# Patient Record
Sex: Male | Born: 1970
Health system: Southern US, Community
[De-identification: ages and names within clinical notes are randomized; demographics above are authoritative.]

## PROBLEM LIST (undated history)

## (undated) DIAGNOSIS — E785 Hyperlipidemia, unspecified: Secondary | ICD-10-CM

## (undated) HISTORY — DX: Hyperlipidemia, unspecified: E78.5

## (undated) HISTORY — PX: KNEE ARTHROSCOPY: SUR90

---

## 2015-06-09 DIAGNOSIS — Z131 Encounter for screening for diabetes mellitus: Secondary | ICD-10-CM | POA: Diagnosis not present

## 2015-06-09 DIAGNOSIS — Z13220 Encounter for screening for lipoid disorders: Secondary | ICD-10-CM | POA: Diagnosis not present

## 2015-06-09 DIAGNOSIS — Z0000 Encounter for general adult medical examination without abnormal findings: Secondary | ICD-10-CM | POA: Diagnosis not present

## 2016-09-13 DIAGNOSIS — Z131 Encounter for screening for diabetes mellitus: Secondary | ICD-10-CM | POA: Diagnosis not present

## 2016-09-13 DIAGNOSIS — G4700 Insomnia, unspecified: Secondary | ICD-10-CM | POA: Diagnosis not present

## 2016-09-13 DIAGNOSIS — Z0000 Encounter for general adult medical examination without abnormal findings: Secondary | ICD-10-CM | POA: Diagnosis not present

## 2016-09-13 DIAGNOSIS — Z13220 Encounter for screening for lipoid disorders: Secondary | ICD-10-CM | POA: Diagnosis not present

## 2016-12-07 DIAGNOSIS — Z23 Encounter for immunization: Secondary | ICD-10-CM | POA: Diagnosis not present

## 2017-07-10 ENCOUNTER — Emergency Department (HOSPITAL_COMMUNITY)
Admission: EM | Admit: 2017-07-10 | Discharge: 2017-07-10 | Disposition: A | Discharge status: Home / Self Care | Payer: BLUE CROSS/BLUE SHIELD | Attending: Physician Assistant | Admitting: Physician Assistant

## 2017-07-10 ENCOUNTER — Encounter (HOSPITAL_COMMUNITY): Payer: Self-pay | Admitting: Emergency Medicine

## 2017-07-10 ENCOUNTER — Emergency Department (HOSPITAL_COMMUNITY): Payer: BLUE CROSS/BLUE SHIELD

## 2017-07-10 ENCOUNTER — Other Ambulatory Visit: Payer: Self-pay

## 2017-07-10 DIAGNOSIS — R0789 Other chest pain: Secondary | ICD-10-CM | POA: Insufficient documentation

## 2017-07-10 DIAGNOSIS — R079 Chest pain, unspecified: Secondary | ICD-10-CM | POA: Diagnosis not present

## 2017-07-10 LAB — CBC
HCT: 42.5 % (ref 39.0–52.0)
HEMOGLOBIN: 14.9 g/dL (ref 13.0–17.0)
MCH: 32.2 pg (ref 26.0–34.0)
MCHC: 35.1 g/dL (ref 30.0–36.0)
MCV: 91.8 fL (ref 78.0–100.0)
Platelets: 304 10*3/uL (ref 150–400)
RBC: 4.63 MIL/uL (ref 4.22–5.81)
RDW: 12.1 % (ref 11.5–15.5)
WBC: 6.5 10*3/uL (ref 4.0–10.5)

## 2017-07-10 LAB — I-STAT TROPONIN, ED
Troponin i, poc: 0 ng/mL (ref 0.00–0.08)
Troponin i, poc: 0 ng/mL (ref 0.00–0.08)

## 2017-07-10 LAB — BASIC METABOLIC PANEL
ANION GAP: 9 (ref 5–15)
BUN: 11 mg/dL (ref 6–20)
CHLORIDE: 105 mmol/L (ref 101–111)
CO2: 25 mmol/L (ref 22–32)
CREATININE: 0.88 mg/dL (ref 0.61–1.24)
Calcium: 9.4 mg/dL (ref 8.9–10.3)
GFR calc Af Amer: >60 mL/min (ref >60)
GFR calc non Af Amer: >60 mL/min (ref >60)
Glucose, Bld: 108 mg/dL — ABNORMAL HIGH (ref 65–99)
Potassium: 3.7 mmol/L (ref 3.5–5.1)
Sodium: 139 mmol/L (ref 135–145)

## 2017-07-10 LAB — D-DIMER, QUANTITATIVE: D-Dimer, Quant: <0.27 ug/mL-FEU (ref 0.00–0.50)

## 2017-07-10 NOTE — ED Provider Notes (Signed)
[facility] EMERGENCY DEPARTMENT Provider Note   CSN: [id number] Arrival date & time: 07/10/17  1246     History   Chief Complaint Chief Complaint  Patient presents with  . Chest Pain    HPI Khiree Bukhari is a 47 y.o. male.  HPI   Patient is a 47 year old male with chest pain.  Patient has no past medical history of hypertension hyperlipidemia or diabetes.  Patient did have a father who had a heart attack at the age of 79.  Patient otherwise is a non-smoker.  She awoke at 2 AM this morning with feeling of indigestion as well as chest pressure.  Patient has been feeling a little bit lightheaded since.  No radiation of chest pain to either arms or neck.  Patient has no shortness of breath or diaphoresis.  Patient does travel long distances for work including multiple plane flights this week over 2 hours long.  Of note patient is also been working in the yard, using a pressure washer.  He reports the he has chest pain that feels mildly worse with movement, not with exertion.  History reviewed. No pertinent past medical history.  There are no active problems to display for this patient.   History reviewed. No pertinent surgical history.      Home Medications    Prior to Admission medications   Not on File    Family History No family history on file.  Social History Social History   Tobacco Use  . Smoking status: Never Smoker  . Smokeless tobacco: Never Used  Substance Use Topics  . Alcohol use: Yes  . Drug use: Not Currently     Allergies   Patient has no known allergies.   Review of Systems Review of Systems  Constitutional: Negative for fatigue and fever.  Respiratory: Positive for chest tightness.   Cardiovascular: Positive for chest pain. Negative for palpitations and leg swelling.  Gastrointestinal: Negative for nausea.  All other systems reviewed and are negative.    Physical Exam Updated Vital Signs BP 130/88   Pulse 78    Temp 98.1 F (36.7 C) (Oral)   Resp (!) 28   Ht  (1.676 m)   Wt 90.7 kg (200 lb)   SpO2 98%   BMI 32.28 kg/m   Physical Exam  Constitutional: He is oriented to person, place, and time. He appears well-nourished.  HENT:  Head: Normocephalic and atraumatic.  Eyes: Pupils are equal, round, and reactive to light. Conjunctivae and EOM are normal.  Cardiovascular: Normal rate, regular rhythm, intact distal pulses and normal pulses.  Pulmonary/Chest: Effort normal and breath sounds normal. No accessory muscle usage. No respiratory distress.  Neurological: He is oriented to person, place, and time.  Skin: Skin is warm and dry. He is not diaphoretic.  Psychiatric: He has a normal mood and affect. His behavior is normal.  Nursing note and vitals reviewed.    ED Treatments / Results  Labs (all labs ordered are listed, but only abnormal results are displayed) Labs Reviewed  BASIC METABOLIC PANEL - Abnormal; Notable for the following components:      Result Value   Glucose, Bld 108 (*)    All other components within normal limits  CBC  D-DIMER, QUANTITATIVE (NOT AT [facility])  I-STAT TROPONIN, ED  I-STAT TROPONIN, ED    EKG EKG Interpretation  Date/Time:  Sunday Jul 10 2017 13:13:24 EDT Ventricular Rate:  70 PR Interval:  184 QRS Duration: 84 QT Interval:  392 QTC Calculation: 423 R Axis:   -9 Text Interpretation:  Normal sinus rhythm ST & T wave abnormality, consider inferior ischemia Abnormal ECG T wave inversion 3 ST depression in V4-V6 Confirmed by [place], Reesa Gotschall ([id number]) on 07/10/2017 3:40:48 PM   Radiology Dg Chest 2 View  Result Date: 07/10/2017 CLINICAL DATA:  Chest pain and tightness. Dizziness and lightheadedness. EXAM: CHEST - 2 VIEW COMPARISON:  None. FINDINGS: Normal sized heart. Clear lungs. Mild diffuse peribronchial thickening. Mild thoracic spine degenerative changes. IMPRESSION: Mild bronchitic changes. Electronically Signed   By: Beckie Salts M.D.   On:  07/10/2017 13:25    Procedures Procedures (including critical care time)  Medications Ordered in ED Medications - No data to display   Initial Impression / Assessment and Plan / ED Course  I have reviewed the triage vital signs and the nursing notes.  Pertinent labs & imaging results that were available during my care of the patient were reviewed by me and considered in my medical decision making (see chart for details).     Patient is a 47 year old male with chest pain.  Patient has no past medical history of hypertension hyperlipidemia or diabetes.  Patient did have a father who had a heart attack at the age of 11.  Patient otherwise is a non-smoker.  She awoke at 2 AM this morning with feeling of indigestion as well as chest pressure.  Patient has been feeling a little bit lightheaded since.  No radiation of chest pain to either arms or neck.  Patient has no shortness of breath or diaphoresis.  Patient does travel long distances for work including multiple plane flights this week over 2 hours long.  Of note patient is also been working in the yard, using a pressure washer.  He reports the he has chest pain that feels mildly worse with movement, not with exertion. Does not radiate to back.  5:52 PM Delta troponin negative.  However patient has abnormal EKG with mild depressions in lateral leads and T wave inversion in lead III.  Slight depressions, posterior EKG ordered.  Reassuring.  6:47 PM Discussed with cardiology.  They reviewed the EKG and feel that it is safe to go home.  Follow-up with PCP.  Discussed diagnostic uncertainty and return with chest pain.  Final Clinical Impressions(s) / ED Diagnoses   Final diagnoses:  Atypical chest pain    ED Discharge Orders    None       Abelino Derrick, MD 07/10/17 [phone]

## 2017-07-10 NOTE — Discharge Instructions (Addendum)
We are unsure what caused your chest pain today.  We are reassured by troponins that were negative as well as your EKG.  We had cardiology review your EKG as well.  We want you to follow-up with them as an outpatient.  The phone number was provided.  If you have any increase in chest pain, especially associated with shortness of breath, sweating or radiation down either of your arms please return immediately to the emergency department.

## 2017-07-10 NOTE — ED Triage Notes (Signed)
Pt. Stated, I was woken up by indigestion and took a Zantac, feeling dizziness and lightheadedness.

## 2017-07-10 NOTE — ED Notes (Signed)
Pt departed in NAD, refused use of wheelchair.  

## 2017-09-29 DIAGNOSIS — Z0289 Encounter for other administrative examinations: Secondary | ICD-10-CM | POA: Diagnosis not present

## 2017-09-29 DIAGNOSIS — Z130 Encounter for screening for diseases of the blood and blood-forming organs and certain disorders involving the immune mechanism: Secondary | ICD-10-CM | POA: Diagnosis not present

## 2017-09-29 DIAGNOSIS — Z13220 Encounter for screening for lipoid disorders: Secondary | ICD-10-CM | POA: Diagnosis not present

## 2017-09-29 DIAGNOSIS — Z136 Encounter for screening for cardiovascular disorders: Secondary | ICD-10-CM | POA: Diagnosis not present

## 2017-10-03 DIAGNOSIS — M79671 Pain in right foot: Secondary | ICD-10-CM | POA: Diagnosis not present

## 2017-12-05 DIAGNOSIS — Z23 Encounter for immunization: Secondary | ICD-10-CM | POA: Diagnosis not present

## 2019-02-12 NOTE — Progress Notes (Addendum)
Patient referred by Orpah Melter, MD for palpitations  Subjective:   Michael Potter, male    DOB: 1970/11/02, 48 y.o.   MRN: [id number]   Chief Complaint  Patient presents with  . Palpitations     HPI  48 y.o. Caucasian male male with strong family history of early coronary artery disease, referred for evaluation of the stratification, and palpitations.  Patient works as a Engineer, maintenance of a Building services engineer.  He stays active, although does not do any regular exercise.  He does not have any baseline chest pain symptoms, other than one episode that occurred 2 years ago.  At that time, it was attributed to muscle spasm or acid reflux.  He has not had any chest pain since then.  Few weeks ago, he had 2 episodes of fast heart rate around 100-110 bpm at rest.  There was no alerts on his Kardia mobile app.  He denies any shortness of breath.  He is concerned, given his family history of coronary artery disease at early age.  Patient's recent lipid panel reviewed with the patient, details below.  History reviewed. No pertinent past medical history.   Past Surgical History:  Procedure Laterality Date  . KNEE ARTHROSCOPY       Social History   Tobacco Use  Smoking Status Never Smoker  Smokeless Tobacco Never Used    Social History   Substance and Sexual Activity  Alcohol Use Yes   Comment: occ     Family History  Problem Relation Age of Onset  . Asthma Mother   . Hypertension Mother   . Heart attack Father        First MI in 52s, multiple MIs, and CABG since then.  . Heart attack Paternal Uncle      Current Outpatient Medications on File Prior to Visit  Medication Sig Dispense Refill  . Zolpidem Tartrate (AMBIEN PO) Take by mouth as needed. Maybe once a week     No current facility-administered medications on file prior to visit.    Cardiovascular and other pertinent studies:  EKG 02/13/2019: Sinus rhythm 63 bpm. Nonspecific T  wave inversion inferior leads.   EKG 02/05/2019: Sinus rhythm 71 bpm. Low voltage. Poor R wave progression   Recent labs: 02/05/2019: Glucose 94, BUN/Cr 11/0.97. EGFR 83. Na/K 139/4.4. Rest of the CMP normal H/H 14/43. MCV 94. Platelets 285 Chol 145, TG 278, HDL 33, LDL 67   Review of Systems  Constitution: Negative for decreased appetite, malaise/fatigue, weight gain and weight loss.  HENT: Negative for congestion.   Eyes: Negative for visual disturbance.  Cardiovascular: Positive for palpitations. Negative for chest pain, dyspnea on exertion, leg swelling and syncope.  Respiratory: Negative for cough.   Endocrine: Negative for cold intolerance.  Hematologic/Lymphatic: Does not bruise/bleed easily.  Skin: Negative for itching and rash.  Musculoskeletal: Negative for myalgias.  Gastrointestinal: Negative for abdominal pain, nausea and vomiting.  Genitourinary: Negative for dysuria.  Neurological: Negative for dizziness and weakness.  Psychiatric/Behavioral: The patient is not nervous/anxious.   All other systems reviewed and are negative.        Vitals:   02/13/19 0909  BP: 126/85  Pulse: 80  SpO2: 95%     Body mass index is 35.19 kg/m. Filed Weights   02/13/19 0909  Weight: 218 lb (98.9 kg)     Objective:   Physical Exam  Constitutional: He is oriented to person, place, and time. He appears well-developed and well-nourished. No  distress.  HENT:  Head: Normocephalic and atraumatic.  Eyes: Pupils are equal, round, and reactive to light. Conjunctivae are normal.  Neck: No JVD present.  Cardiovascular: Normal rate, regular rhythm and intact distal pulses.  No murmur heard. Pulmonary/Chest: Effort normal and breath sounds normal. He has no wheezes. He has no rales.  Abdominal: Soft. Bowel sounds are normal. There is no rebound.  Musculoskeletal:        General: No edema.  Lymphadenopathy:    He has no cervical adenopathy.  Neurological: He is alert and  oriented to person, place, and time. No cranial nerve deficit.  Skin: Skin is warm and dry.  Psychiatric: He has a normal mood and affect.  Nursing note and vitals reviewed.          Assessment & Recommendations:   48 y.o. Caucasian male male with strong family history of early coronary artery disease, referred for evaluation of the stratification, and palpitations.  Palpitations: Occasional episodes.  No other regarding irregular heart rhythm on his Kardia mobile app.  Although not 100% sensitive, this reduces likelihood of atrial fibrillation.  We discussed placement with a monitor.  Initially decided to use this, should he have more frequent and recurrent symptoms.  Restratification: Patient has a strong family history of early coronary artery disease on his father's side.  I recommend calcium score scan and lipoprotein (a) level for stratification.  Elevated triglyceride: Triglyceride elevated on fasting lipid panel at 278.  According to the patient, this is unusual for him.  His LDL is at goal, although HDL is low.  I do not recommend starting any pharmacological therapy at this time.  Should he have elevated calcium score, will start on statin.  If calcium score is 0, recommend repeating testing with a panel in 4 weeks.   Addendum: Calcium score 12 in RCA. Discussed with the patient. Recommend starting rosuvastatin 5 mg daily. Repeat lipid panel and follow up in 3 months. Given mild plaque, Aspirin not necessary.   Diet & Lifestyle recommendations:  Physical activity recommendation (The Physical Activity Guidelines for Americans. JAMA 2018;Nov 12) At least 150-300 minutes a week of moderate-intensity, or 75-150 minutes a week of vigorous-intensity aerobic physical activity, or an equivalent combination of moderate- and vigorous-intensity aerobic activity. Adults should perform muscle-strengthening activities on 2 or more days a week. Older adults should do multicomponent physical  activity that includes balance training as well as aerobic and muscle-strengthening activities. Benefits of increased physical activity include lower risk of mortality including cardiovascular mortality, lower risk of cardiovascular events and associated risk factors (hypertension and diabetes), and lower risk of many cancers (including bladder, breast, colon, endometrium, esophagus, kidney, lung, and stomach). Additional improvments have been seen in cognition, risk of dementia, anxiety and depression, improved bone health, lower risk of falls, and associated injuries.  Dietary recommendation The 2019 ACC/AHA guidelines promote nutrition as a main fixture of cardiovascular wellness, with a recommendation for a varied diet of fruit, vegetables, fish, legumes, and whole grains (Class I), as well as recommendations to reduce sodium, cholesterol, processed meats, and refined sugars (Class IIa recommendation).10 Sodium intake, a topic of some controversy as of late, is recommended to be kept at 1,500 mg/day or less, far below the average daily intake in the [place] of 3,409 mg/day, and notably below that of previous [place] recommendations for <2,373m/day.10,11 For those unable to reach 1,500 mg/day, they recommend at least a reduction of 1000 mg/day.  A Pesco-Mediterranean Diet With Intermittent Fasting: JACC Review  Topic of the Week. J Am Coll Cardiol 2481;85:9093-1121 Pesco-Mediterranean diet, it is supplemented with extra-virgin olive oil (EVOO), which is the principle fat source, along with moderate amounts of dairy (particularly yogurt and cheese) and eggs, as well as modest amounts of alcohol consumption (ideally red wine with the evening meal), but few red and processed meats.    Thank you for referring the patient to [place]. Please feel free to contact with any questions.  Nigel Mormon, MD [facility] [place] Pager: [phone] Office: [phone]

## 2019-02-13 ENCOUNTER — Other Ambulatory Visit: Payer: Self-pay

## 2019-02-13 ENCOUNTER — Ambulatory Visit (INDEPENDENT_AMBULATORY_CARE_PROVIDER_SITE_OTHER): Payer: 59 | Admitting: Cardiology

## 2019-02-13 ENCOUNTER — Encounter: Payer: Self-pay | Admitting: Cardiology

## 2019-02-13 ENCOUNTER — Telehealth: Payer: Self-pay

## 2019-02-13 ENCOUNTER — Other Ambulatory Visit: Payer: Self-pay | Admitting: Cardiology

## 2019-02-13 VITALS — BP 126/85 | HR 80 | Ht 66.0 in | Wt 218.0 lb

## 2019-02-13 DIAGNOSIS — R931 Abnormal findings on diagnostic imaging of heart and coronary circulation: Secondary | ICD-10-CM | POA: Diagnosis not present

## 2019-02-13 DIAGNOSIS — R002 Palpitations: Secondary | ICD-10-CM

## 2019-02-13 DIAGNOSIS — E782 Mixed hyperlipidemia: Secondary | ICD-10-CM

## 2019-02-13 DIAGNOSIS — Z8249 Family history of ischemic heart disease and other diseases of the circulatory system: Secondary | ICD-10-CM | POA: Diagnosis not present

## 2019-02-13 MED ORDER — ROSUVASTATIN CALCIUM 5 MG PO TABS: 5.0000 mg | ORAL_TABLET | Freq: Every day | ORAL | 3 refills | Status: DC

## 2019-02-13 NOTE — Addendum Note (Signed)
Addended by: Nigel Mormon on: 02/13/2019 02:23 PM   Modules accepted: Orders

## 2019-02-13 NOTE — Telephone Encounter (Signed)
MP updated his follow-up instructions for [facility] 02/13/2019  "Needs lipid panel and f/u in 3 months"   Appt sch for 05/14/2018 @ 11:30 am. appt reminder mailed to pt

## 2019-04-06 ENCOUNTER — Other Ambulatory Visit: Payer: Self-pay

## 2019-04-06 DIAGNOSIS — R931 Abnormal findings on diagnostic imaging of heart and coronary circulation: Secondary | ICD-10-CM

## 2019-04-06 DIAGNOSIS — Z8249 Family history of ischemic heart disease and other diseases of the circulatory system: Secondary | ICD-10-CM

## 2019-04-06 MED ORDER — ROSUVASTATIN CALCIUM 5 MG PO TABS: 5.0000 mg | ORAL_TABLET | Freq: Every day | ORAL | 1 refills | Status: DC

## 2019-05-11 LAB — LIPID PANEL
Chol/HDL Ratio: 3 ratio (ref 0.0–5.0)
Cholesterol, Total: 105 mg/dL (ref 100–199)
HDL: 35 mg/dL — ABNORMAL LOW (ref >39)
LDL Chol Calc (NIH): 41 mg/dL (ref 0–99)
Triglycerides: 174 mg/dL — ABNORMAL HIGH (ref 0–149)
VLDL Cholesterol Cal: 29 mg/dL (ref 5–40)

## 2019-05-11 LAB — LIPOPROTEIN A (LPA): Lipoprotein (a): <8.4 nmol/L (ref <75.0)

## 2019-05-14 ENCOUNTER — Encounter: Payer: Self-pay | Admitting: Cardiology

## 2019-05-14 ENCOUNTER — Ambulatory Visit: Payer: 59 | Admitting: Cardiology

## 2019-05-14 ENCOUNTER — Other Ambulatory Visit: Payer: Self-pay

## 2019-05-14 VITALS — BP 127/84 | HR 58 | Temp 98.6°F | Resp 17 | Ht 66.0 in | Wt 220.0 lb

## 2019-05-14 DIAGNOSIS — R002 Palpitations: Secondary | ICD-10-CM

## 2019-05-14 DIAGNOSIS — Z8249 Family history of ischemic heart disease and other diseases of the circulatory system: Secondary | ICD-10-CM

## 2019-05-14 DIAGNOSIS — R931 Abnormal findings on diagnostic imaging of heart and coronary circulation: Secondary | ICD-10-CM

## 2019-05-14 MED ORDER — ROSUVASTATIN CALCIUM 5 MG PO TABS: 5.0000 mg | ORAL_TABLET | Freq: Every day | ORAL | 3 refills | Status: AC

## 2019-05-14 NOTE — Progress Notes (Signed)
    Patient referred by Orpah Melter, MD for palpitations  Subjective:   Michael Potter, male    DOB: [birth year], 49 y.o.   MRN: [id number]   Chief Complaint  Patient presents with  . Palpitations  . Hyperlipidemia  . Follow-up    3 month  . Results    lipid     HPI  49 y.o. Caucasian male male with elevated calcium score, elevated TG, strong family history of early coronary artery disease, palpitations.  Palpitation symptoms have essentially resolved.  Triglycerides have improved on low-dose Crestor 5 mg daily.  Patient denies chest pain, shortness of breath, palpitations, leg edema, orthopnea, PND, TIA/syncope.   Current Outpatient Medications on File Prior to Visit  Medication Sig Dispense Refill  . rosuvastatin (CRESTOR) 5 MG tablet Take 1 tablet (5 mg total) by mouth daily. 90 tablet 1  . Zolpidem Tartrate (AMBIEN PO) Take by mouth as needed. Maybe once a week     No current facility-administered medications on file prior to visit.    Cardiovascular and other pertinent studies:  EKG 02/13/2019: Sinus rhythm 63 bpm. Nonspecific T wave inversion inferior leads.   Recent labs: 05/10/2019: Chol 105, TG 174, HDL 35, LDL 41 Lipoprotein a <8 (Normal)  02/05/2019: Glucose 94, BUN/Cr 11/0.97. EGFR 83. Na/K 139/4.4. Rest of the CMP normal H/H 14/43. MCV 94. Platelets 285 Chol 145, TG 278, HDL 33, LDL 67   Review of Systems  Cardiovascular: Negative for chest pain, dyspnea on exertion, leg swelling, palpitations and syncope.         Vitals:   05/14/19 1126 05/14/19 1127  BP: (!) 126/92 127/84  Pulse: (!) 58 (!) 58  Resp: 17   Temp: 98.6 F (37 C)   SpO2: 96%      Body mass index is 35.51 kg/m. Filed Weights   05/14/19 1126  Weight: 220 lb (99.8 kg)     Objective:   Physical Exam  Constitutional: He appears well-developed and well-nourished.  Neck: No JVD present.  Cardiovascular: Normal rate, regular rhythm, normal heart sounds and intact  distal pulses.  No murmur heard. Pulmonary/Chest: Effort normal and breath sounds normal. He has no wheezes. He has no rales.  Musculoskeletal:        General: No edema.  Nursing note and vitals reviewed.       Assessment & Recommendations:   49 y.o. Caucasian male male with strong family history of early coronary artery disease, palpitations.  Palpitations: Resolved  Elevated triglyceride: Now improved, down to 174.  Elevated calcium score: Calcium score 12 in RCA. Currently on rosuvastatin 5 mg daily.  Given mild plaque, Aspirin not necessary.   Continue follow-up with PCP.  I will see him on as-needed basis.  Nigel Mormon, MD [facility] Pager: [phone] Office: [phone]

## 2019-12-09 IMAGING — CR DG CHEST 2V
2 series · 2 of 2 positions shown · non-contrast
Comparison: None.

CLINICAL DATA: Chest pain and tightness. Dizziness and
lightheadedness.

EXAM:
CHEST - 2 VIEW

[chest pa]
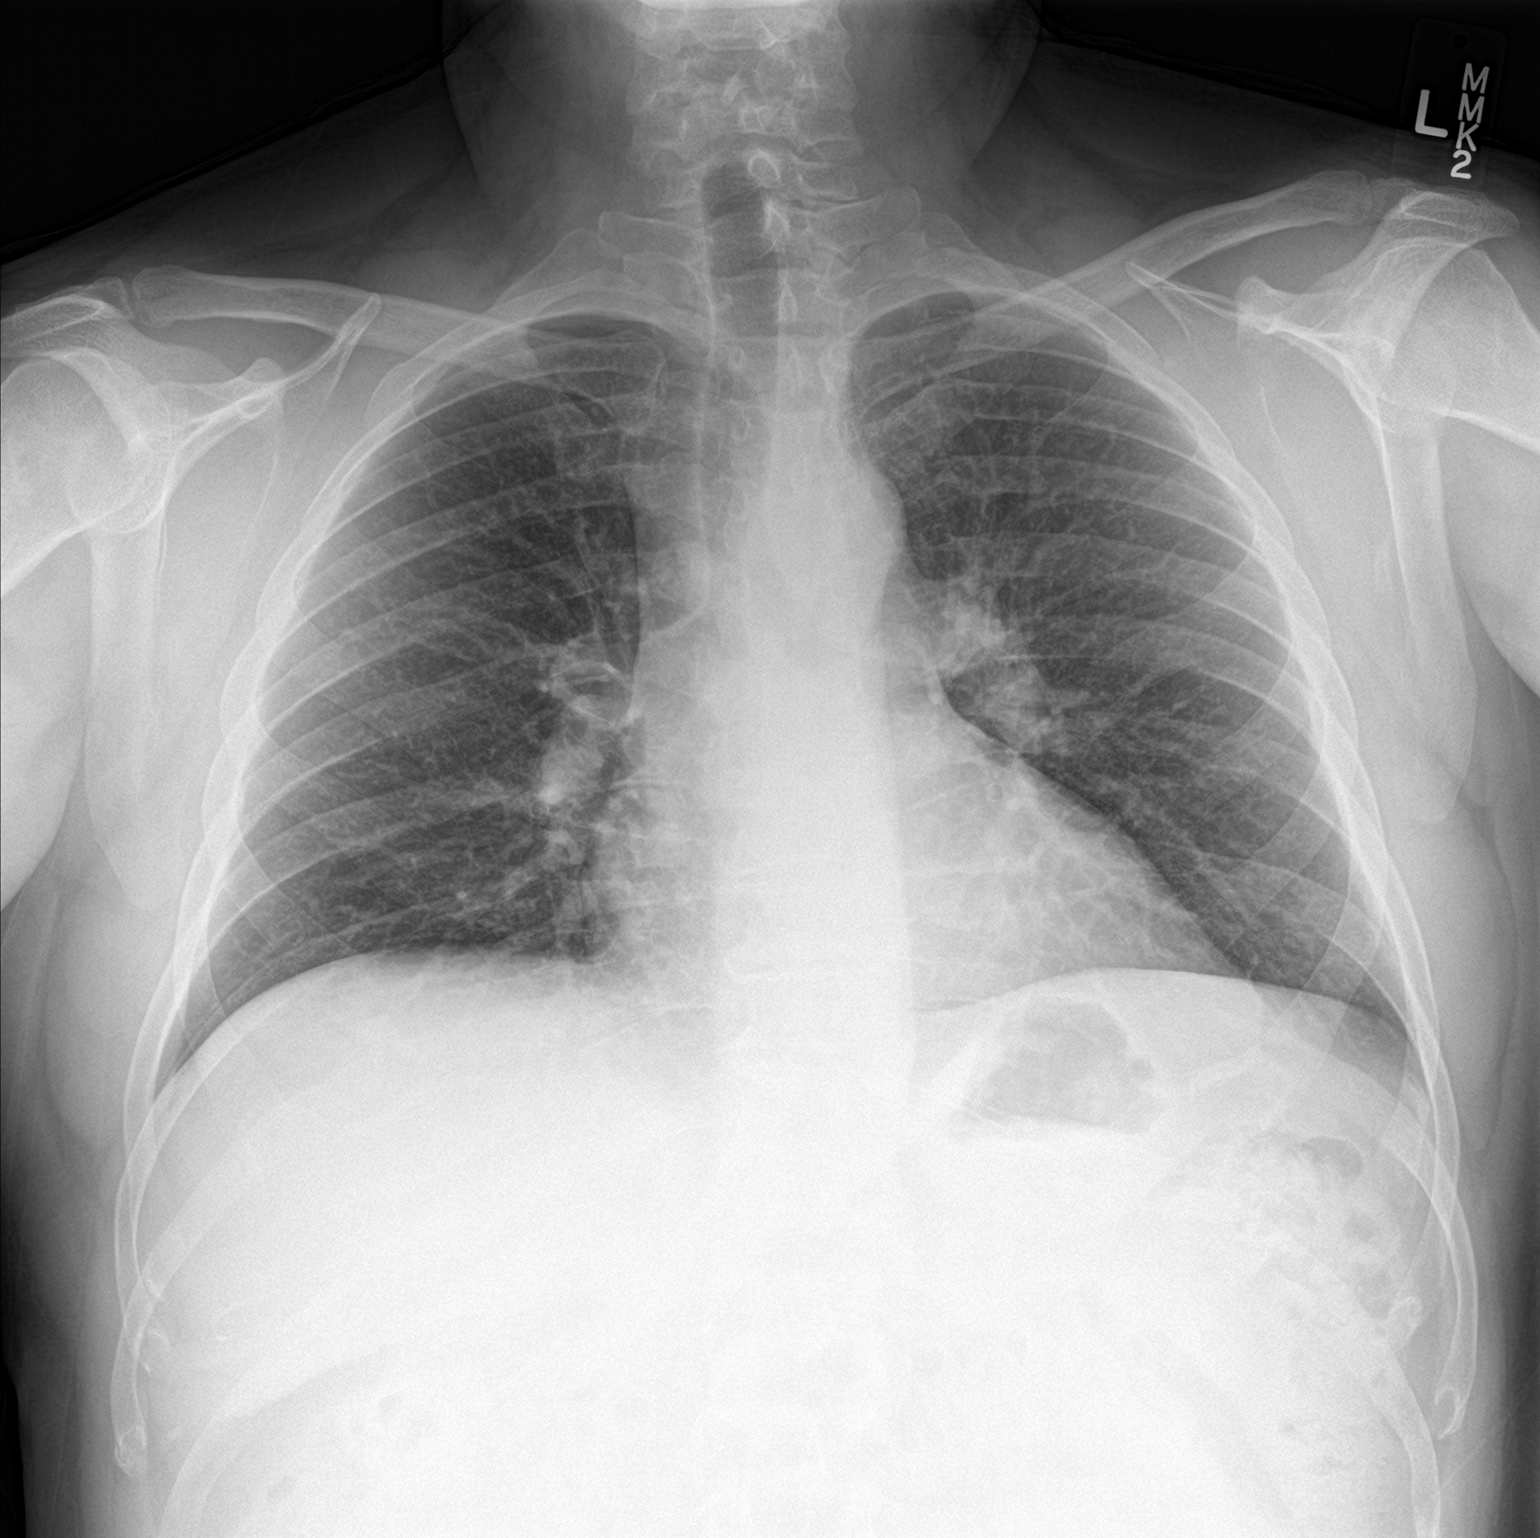

[chest lat]
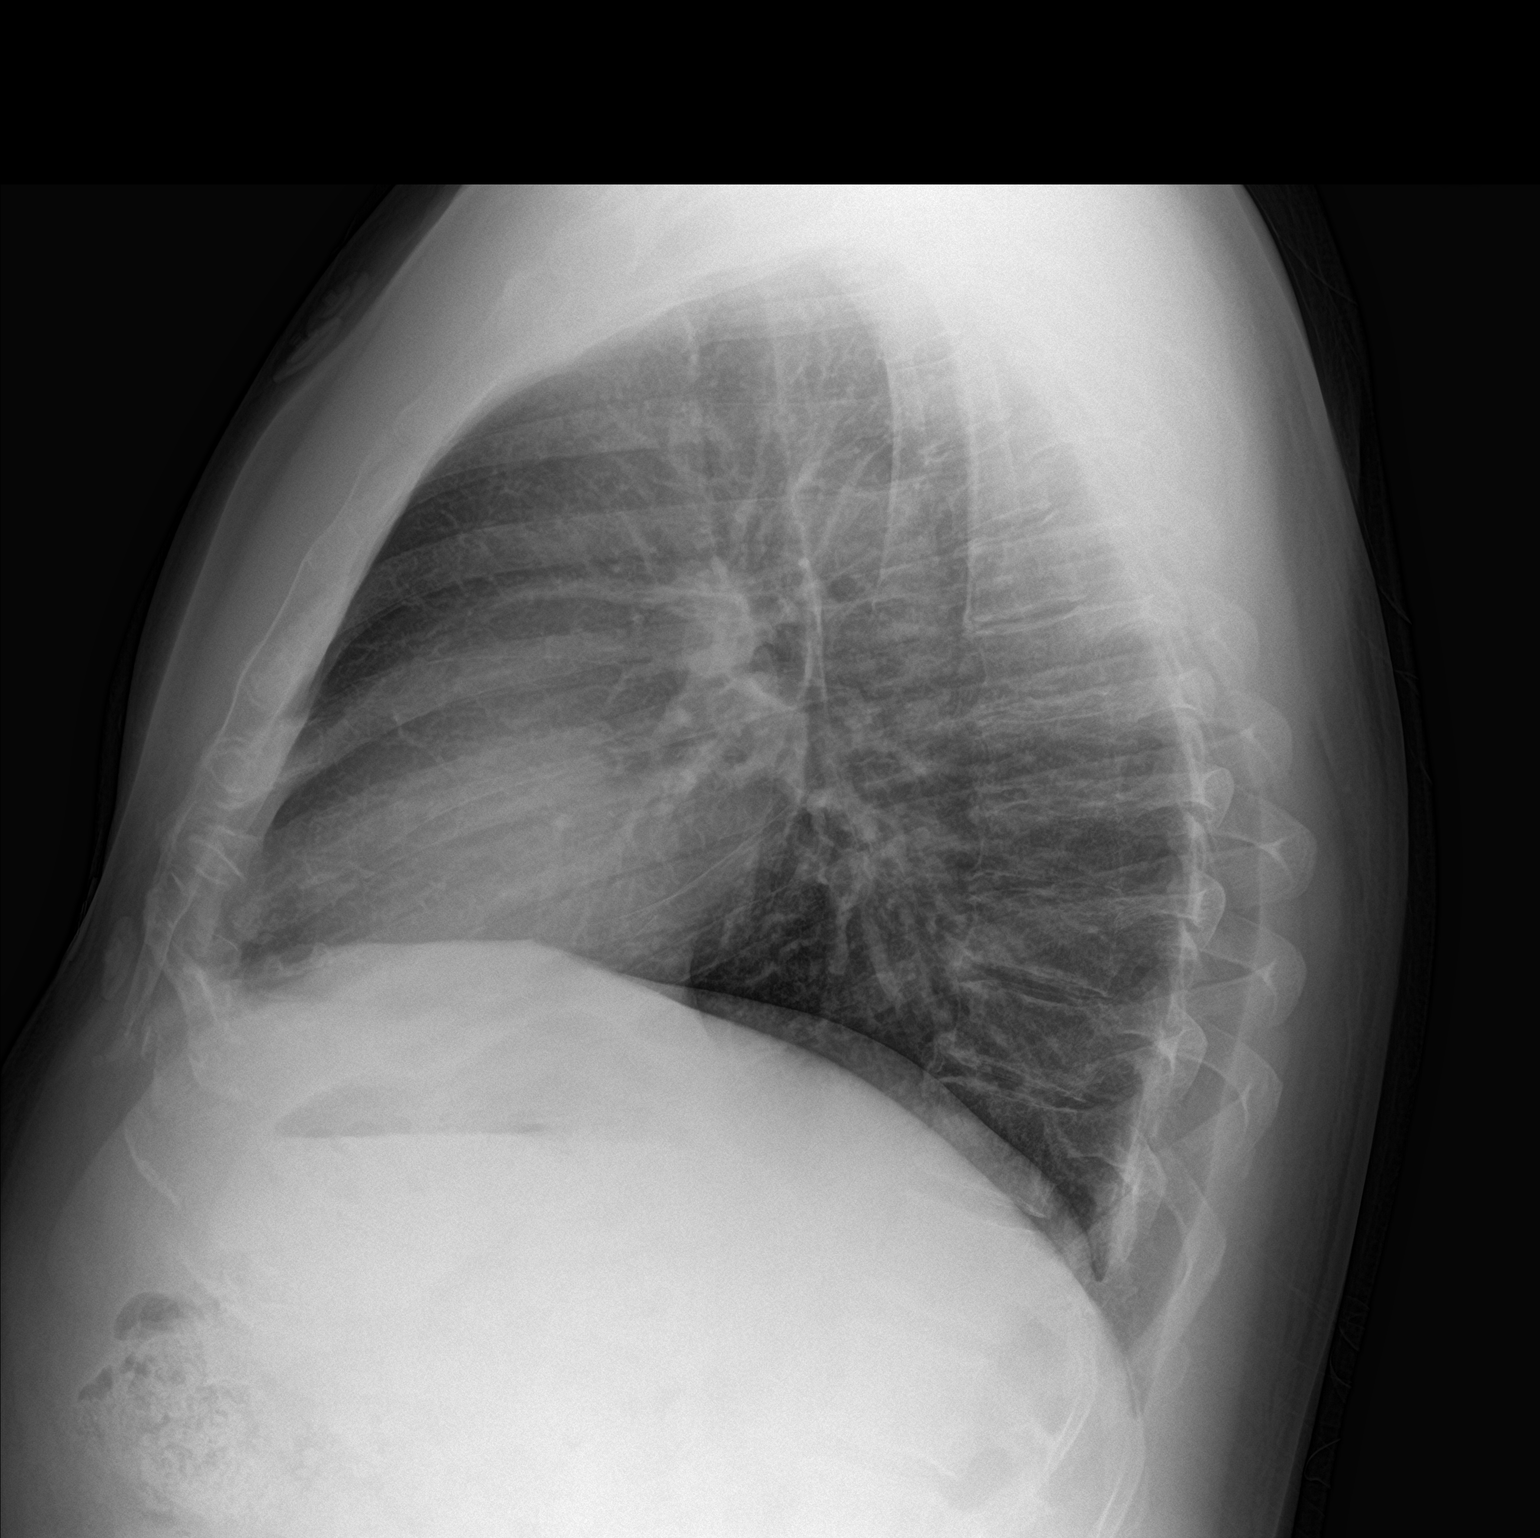

[2 of 2 positions shown; findings below may reference images not displayed]

FINDINGS: Normal sized heart. Clear lungs. Mild diffuse peribronchial
thickening. Mild thoracic spine degenerative changes.
IMPRESSION: Mild bronchitic changes.

## 2022-07-20 ENCOUNTER — Other Ambulatory Visit: Payer: Self-pay | Admitting: Gastroenterology

## 2022-07-20 ENCOUNTER — Ambulatory Visit
Admission: RE | Admit: 2022-07-20 | Discharge: 2022-07-20 | Disposition: A | Discharge status: Home / Self Care | Payer: Managed Care, Other (non HMO) | Source: Ambulatory Visit | Attending: Gastroenterology | Admitting: Gastroenterology

## 2022-07-20 DIAGNOSIS — R1031 Right lower quadrant pain: Secondary | ICD-10-CM

## 2023-03-03 ENCOUNTER — Encounter: Payer: Self-pay | Admitting: *Deleted

## 2023-03-07 ENCOUNTER — Encounter: Payer: Self-pay | Admitting: Diagnostic Neuroimaging

## 2023-03-07 ENCOUNTER — Ambulatory Visit: Payer: Managed Care, Other (non HMO) | Admitting: Diagnostic Neuroimaging

## 2023-03-07 VITALS — BP 108/62 | HR 61 | Ht 66.0 in | Wt 232.0 lb

## 2023-03-07 DIAGNOSIS — M5481 Occipital neuralgia: Secondary | ICD-10-CM | POA: Diagnosis not present

## 2023-03-07 NOTE — Progress Notes (Signed)
[facility]  PATIENT: Michael Potter DOB: 1970/06/18  REFERRING CLINICIAN: Joycelyn Rua, MD HISTORY FROM: patient  REASON FOR VISIT: new consult    HISTORICAL  CHIEF COMPLAINT:  Chief Complaint  Patient presents with   Headache    RM 7 alone Pt is well, reports he had been having headaches for about  1-2 yrs. Headaches are always on back R side of head. He was having them 2-3 times a week, currently he has had one in the past one. Can last for a few days.      HISTORY OF PRESENT ILLNESS:    53 year old male here for evaluation of headaches.  For past 1 to 2 years has had onset of intermittent pain on the back right occipital region, slightly into the right posterior neck.  Symptoms can last hours at a time.  Episodes can occur 2-3 times per month.  Typically takes some over-the-counter medications with mild relief.  Separately patient has noted intermittent "smoke smell sensation" which reminded him of his grandparents who smoked cigarettes.  However he is not sure if this is a real smell sensation or something that is being perceived.  This is only occurred when he is by himself.  This has not happened in over a year.   REVIEW OF SYSTEMS: Full 14 system review of systems performed and negative with exception of: as per HPI.  ALLERGIES: No Known Allergies  HOME MEDICATIONS: Outpatient Medications Prior to Visit  Medication Sig Dispense Refill   eszopiclone (LUNESTA) 2 MG TABS tablet Take 2 mg by mouth at bedtime as needed.     rosuvastatin (CRESTOR) 5 MG tablet Take 1 tablet (5 mg total) by mouth daily. 90 tablet 3   Zolpidem Tartrate (AMBIEN PO) Take by mouth as needed. Maybe once a week     No facility-administered medications prior to visit.    PAST MEDICAL HISTORY: Past Medical History:  Diagnosis Date   Hyperlipidemia     PAST SURGICAL HISTORY: Past Surgical History:  Procedure Laterality Date   KNEE ARTHROSCOPY      FAMILY  HISTORY: Family History  Problem Relation Age of Onset   Asthma Mother    Hypertension Mother    Heart attack Father        First MI in 81s, multiple MIs, and CABG since then.   Heart attack Paternal Uncle     SOCIAL HISTORY: Social History   Socioeconomic History   Marital status: Married    Spouse name: Not on file   Number of children: 2   Years of education: Not on file   Highest education level: Not on file  Occupational History   Not on file  Tobacco Use   Smoking status: Never   Smokeless tobacco: Never  Vaping Use   Vaping status: Never Used  Substance and Sexual Activity   Alcohol use: Yes    Comment: occ   Drug use: Not Currently   Sexual activity: Not on file  Other Topics Concern   Not on file  Social History Narrative   2025 - R handed    Lives wife and son   Has two children    Social Drivers of Corporate investment banker Strain: Not on file  Food Insecurity: Not on file  Transportation Needs: Not on file  Physical Activity: Not on file  Stress: Not on file  Social Connections: Unknown (06/28/2021)   Received from [facility], [facility]   Social Network  Social Network: Not on file  Intimate Partner Violence: Unknown (05/20/2021)   Received from [facility], [facility]   HITS    Physically Hurt: Not on file    Insult or Talk Down To: Not on file    Threaten Physical Harm: Not on file    Scream or Curse: Not on file     PHYSICAL EXAM  GENERAL EXAM/CONSTITUTIONAL: Vitals:  Vitals:   03/07/23 0759  BP: 108/62  Pulse: 61  Weight: 232 lb (105.2 kg)  Height: 5\' 6"  (1.676 m)   Body mass index is 37.45 kg/m. Wt Readings from Last 3 Encounters:  03/07/23 232 lb (105.2 kg)  05/14/19 220 lb (99.8 kg)  02/13/19 218 lb (98.9 kg)   Patient is in no distress; well developed, nourished and groomed; neck is supple  CARDIOVASCULAR: Examination of carotid arteries is normal; no carotid bruits Regular rate and rhythm, no  murmurs Examination of peripheral vascular system by observation and palpation is normal  EYES: Ophthalmoscopic exam of optic discs and posterior segments is normal; no papilledema or hemorrhages No results found.  MUSCULOSKELETAL: Gait, strength, tone, movements noted in Neurologic exam below  NEUROLOGIC: MENTAL STATUS:      No data to display         awake, alert, oriented to person, place and time recent and remote memory intact normal attention and concentration language fluent, comprehension intact, naming intact fund of knowledge appropriate  CRANIAL NERVE:  2nd - no papilledema on fundoscopic exam 2nd, 3rd, 4th, 6th - pupils equal and reactive to light, visual fields full to confrontation, extraocular muscles intact, no nystagmus 5th - facial sensation symmetric 7th - facial strength symmetric 8th - hearing intact 9th - palate elevates symmetrically, uvula midline 11th - shoulder shrug symmetric 12th - tongue protrusion midline  MOTOR:  normal bulk and tone, full strength in the BUE, BLE  SENSORY:  normal and symmetric to light touch, temperature, vibration  COORDINATION:  finger-nose-finger, fine finger movements normal  REFLEXES:  deep tendon reflexes 1+ and symmetric  GAIT/STATION:  narrow based gait     DIAGNOSTIC DATA (LABS, IMAGING, TESTING) - I reviewed patient records, labs, notes, testing and imaging myself where available.  Lab Results  Component Value Date   WBC 6.5 07/10/2017   HGB 14.9 07/10/2017   HCT 42.5 07/10/2017   MCV 91.8 07/10/2017   PLT 304 07/10/2017      Component Value Date/Time   NA 139 07/10/2017 1258   K 3.7 07/10/2017 1258   CL 105 07/10/2017 1258   CO2 25 07/10/2017 1258   GLUCOSE 108 (H) 07/10/2017 1258   BUN 11 07/10/2017 1258   CREATININE 0.88 07/10/2017 1258   CALCIUM 9.4 07/10/2017 1258   GFRNONAA >60 07/10/2017 1258   GFRAA >60 07/10/2017 1258   Lab Results  Component Value Date   CHOL 105  05/10/2019   HDL 35 (L) 05/10/2019   LDLCALC 41 05/10/2019   TRIG 174 (H) 05/10/2019   CHOLHDL 3.0 05/10/2019   No results found for: "HGBA1C" No results found for: "VITAMINB12" No results found for: "TSH"    ASSESSMENT AND PLAN  53 y.o. year old male here with:   Dx:  1. Cervico-occipital neuralgia of right side     PLAN:  RIGHT OCCIPITAL NEURALGIA - monitor for now; ibuprofen / tylenol as needed - consider trigger point injection / nerve block in future  TRANSIENT ABNL SMELL SENSATION (cigarette smoke; few minutes) - sporadic; now resolved x 1  year - monitor; if returning, consider MRI brain w/wo  Return for pending if symptoms worsen or fail to improve, return to PCP.    Suanne Marker, MD 03/07/2023, 9:25 AM Certified in Neurology, Neurophysiology and Neuroimaging  [facility] [address] [place], [place] [address] [phone]

## 2023-03-07 NOTE — Patient Instructions (Signed)
  RIGHT OCCIPITAL NEURALGIA - monitor for now; ibuprofen / tylenol as needed - consider trigger point injection / nerve block in future  TRANSIENT ABNL SMELL SENSATION (cigarette smoke; few minutes) - sporadic; now resolved x 1 year - monitor; if returning, consider MRI brain w/wo
